# Patient Record
Sex: Male | Born: 1990 | Race: White | Hispanic: No | Marital: Single | Smoking: Former smoker
Health system: Southern US, Community
[De-identification: ages and names within clinical notes are randomized; demographics above are authoritative.]

---

## 2017-07-09 ENCOUNTER — Other Ambulatory Visit: Payer: Self-pay

## 2017-07-09 ENCOUNTER — Emergency Department: Payer: BLUE CROSS/BLUE SHIELD

## 2017-07-09 ENCOUNTER — Emergency Department
Admission: EM | Admit: 2017-07-09 | Discharge: 2017-07-09 | Disposition: A | Discharge status: Home / Self Care | Payer: BLUE CROSS/BLUE SHIELD | Attending: Emergency Medicine | Admitting: Emergency Medicine

## 2017-07-09 DIAGNOSIS — F17200 Nicotine dependence, unspecified, uncomplicated: Secondary | ICD-10-CM | POA: Insufficient documentation

## 2017-07-09 DIAGNOSIS — R319 Hematuria, unspecified: Secondary | ICD-10-CM | POA: Insufficient documentation

## 2017-07-09 DIAGNOSIS — R109 Unspecified abdominal pain: Secondary | ICD-10-CM | POA: Diagnosis present

## 2017-07-09 LAB — URINALYSIS, COMPLETE (UACMP) WITH MICROSCOPIC
BACTERIA UA: NONE SEEN
BILIRUBIN URINE: NEGATIVE
Glucose, UA: NEGATIVE mg/dL
Ketones, ur: NEGATIVE mg/dL
LEUKOCYTES UA: NEGATIVE
Nitrite: NEGATIVE
PH: 6 (ref 5.0–8.0)
Protein, ur: NEGATIVE mg/dL
SPECIFIC GRAVITY, URINE: 1.017 (ref 1.005–1.030)

## 2017-07-09 LAB — CBC
HCT: 42.9 % (ref 40.0–52.0)
HEMOGLOBIN: 14.9 g/dL (ref 13.0–18.0)
MCH: 30.3 pg (ref 26.0–34.0)
MCHC: 34.8 g/dL (ref 32.0–36.0)
MCV: 87 fL (ref 80.0–100.0)
PLATELETS: 246 10*3/uL (ref 150–440)
RBC: 4.93 MIL/uL (ref 4.40–5.90)
RDW: 12.5 % (ref 11.5–14.5)
WBC: 9.5 10*3/uL (ref 3.8–10.6)

## 2017-07-09 LAB — BASIC METABOLIC PANEL
ANION GAP: 8 (ref 5–15)
BUN: 17 mg/dL (ref 6–20)
CHLORIDE: 105 mmol/L (ref 101–111)
CO2: 26 mmol/L (ref 22–32)
CREATININE: 1.08 mg/dL (ref 0.61–1.24)
Calcium: 8.9 mg/dL (ref 8.9–10.3)
GFR calc non Af Amer: >60 mL/min (ref >60)
Glucose, Bld: 110 mg/dL — ABNORMAL HIGH (ref 65–99)
POTASSIUM: 3.5 mmol/L (ref 3.5–5.1)
SODIUM: 139 mmol/L (ref 135–145)

## 2017-07-09 NOTE — ED Provider Notes (Signed)
[facility] ([facility]) Emergency Department Provider Note  Time seen: 8:05 PM  I have reviewed the triage vital signs and the nursing notes.   HISTORY  Chief Complaint Flank Pain    HPI Kent Thompson is a 27 y.o. male with no significant past medical history presents to the emergency department for right flank pain.  According to the patient for the past 5 days he has been expensing intermittent right flank pain.  Describes as a dull numbing type pain in his right flank states it will move sometimes in the front of his abdomen sometimes in his back.  States he has noticed some mild urinary frequency but denies any dysuria or noted hematuria.  States his mom is a history of kidney stones but he is never personally had one.  Denies any fever, nausea, vomiting, diarrhea.  Largely negative review of systems.  Describes pain currently as mild 3/10 in severity.   History reviewed. No pertinent past medical history.  There are no active problems to display for this patient.   History reviewed. No pertinent surgical history.  Prior to Admission medications   Not on File    Not on File  History reviewed. No pertinent family history.  Social History Social History   Tobacco Use  . Smoking status: Current Some Day Smoker  Substance Use Topics  . Alcohol use: Yes  . Drug use: Not on file    Review of Systems Constitutional: Negative for fever. Eyes: Negative for visual complaints ENT: Negative for recent illness/congestion Cardiovascular: Negative for chest pain. Respiratory: Negative for shortness of breath. Gastrointestinal: Mild right flank pain.  Negative nausea vomiting diarrhea Genitourinary: Positive for urinary frequency.  Negative for dysuria or hematuria. Musculoskeletal: Negative for musculoskeletal complaints Skin: Negative for skin complaints  Neurological: Negative for headache All other ROS  negative  ____________________________________________   PHYSICAL EXAM:  VITAL SIGNS: ED Triage Vitals  Enc Vitals Group     BP 07/09/17 1817 (!) 155/81     Pulse Rate 07/09/17 1817 98     Resp 07/09/17 1817 16     Temp 07/09/17 1817 98.1 F (36.7 C)     Temp Source 07/09/17 1817 Oral     SpO2 07/09/17 1817 100 %     Weight 07/09/17 1819 160 lb (72.6 kg)     Height 07/09/17 1819  (1.803 m)     Head Circumference --      Peak Flow --      Pain Score 07/09/17 1818 3     Pain Loc --      Pain Edu? --      Excl. in GC? --    Constitutional: Alert and oriented. Well appearing and in no distress. Eyes: Normal exam ENT   Head: Normocephalic and atraumatic   Mouth/Throat: Mucous membranes are moist. Cardiovascular: Normal rate, regular rhythm. No murmur Respiratory: Normal respiratory effort without tachypnea nor retractions. Breath sounds are clear  Gastrointestinal: Soft and nontender. No distention.  Mild right CVA tenderness. Musculoskeletal: Nontender with normal range of motion in all extremities.  Neurologic:  Normal speech and language. No gross focal neurologic deficits Skin:  Skin is warm, dry and intact.  Psychiatric: Mood and affect are normal.  ____________________________________________    RADIOLOGY  CT scan negative for acute process.  ____________________________________________   INITIAL IMPRESSION / ASSESSMENT AND PLAN / ED COURSE  Pertinent labs & imaging results that were available during my care of the patient were reviewed by me  and considered in my medical decision making (see chart for details).  Patient presents to the emergency department for right flank pain intermittent over the past 5 days currently mild 3/10.  Differential would include ureterolithiasis, pyelonephritis, urinary tract infection, colitis, diverticulitis, gallbladder disease or appendicitis.  Overall the patient appears very well, no anterior abdominal tenderness  on exam but the patient does have mild right CVA tenderness.  Patient's labs show normal white blood cell count, normal kidney function.  Patient's urinalysis shows 6-10 red blood cells, given his history consistent with ureterolithiasis with urinalysis findings consistent with ureterolithiasis will obtain a CT scan of the patient's abdomen/pelvis to further evaluate as the patient has no prior history of kidney stones.  CT scan is negative.  Patient appears very well.  Given a [place] amount of blood and urine I have sent a urine culture.  Suspect recently passed stone.  We will discharge with supportive care at home Tylenol/ibuprofen as well as fluids and PCP follow-up if needed.  ____________________________________________   FINAL CLINICAL IMPRESSION(S) / ED DIAGNOSES  Right flank pain    Minna Antis, MD 07/09/17 2148

## 2017-07-09 NOTE — ED Triage Notes (Signed)
Pt arrives to ED with c/o of R sided back/flank pain. States it started near ribs and is now low back. States mother has kidney stones. No personal hx of kidney stones. States pain has come and gone over past week. States urinary frequency. Denies hematuria. Denies N&V&D& fever.   Alert, oriented, ambulatory.

## 2017-07-11 ENCOUNTER — Emergency Department: Payer: BLUE CROSS/BLUE SHIELD

## 2017-07-11 ENCOUNTER — Encounter: Payer: Self-pay | Admitting: Emergency Medicine

## 2017-07-11 ENCOUNTER — Emergency Department
Admission: EM | Admit: 2017-07-11 | Discharge: 2017-07-11 | Disposition: A | Discharge status: Home / Self Care | Payer: BLUE CROSS/BLUE SHIELD | Attending: Emergency Medicine | Admitting: Emergency Medicine

## 2017-07-11 ENCOUNTER — Other Ambulatory Visit: Payer: Self-pay

## 2017-07-11 DIAGNOSIS — R079 Chest pain, unspecified: Secondary | ICD-10-CM | POA: Diagnosis not present

## 2017-07-11 DIAGNOSIS — R1011 Right upper quadrant pain: Secondary | ICD-10-CM | POA: Insufficient documentation

## 2017-07-11 DIAGNOSIS — F17200 Nicotine dependence, unspecified, uncomplicated: Secondary | ICD-10-CM | POA: Diagnosis not present

## 2017-07-11 LAB — URINALYSIS, COMPLETE (UACMP) WITH MICROSCOPIC
BACTERIA UA: NONE SEEN
BILIRUBIN URINE: NEGATIVE
Glucose, UA: NEGATIVE mg/dL
Ketones, ur: 80 mg/dL — AB
Leukocytes, UA: NEGATIVE
Nitrite: NEGATIVE
PROTEIN: NEGATIVE mg/dL
SPECIFIC GRAVITY, URINE: 1.024 (ref 1.005–1.030)
SQUAMOUS EPITHELIAL / LPF: NONE SEEN (ref 0–5)
pH: 5 (ref 5.0–8.0)

## 2017-07-11 LAB — COMPREHENSIVE METABOLIC PANEL
ALBUMIN: 4.7 g/dL (ref 3.5–5.0)
ALT: 22 U/L (ref 17–63)
ANION GAP: 9 (ref 5–15)
AST: 25 U/L (ref 15–41)
Alkaline Phosphatase: 52 U/L (ref 38–126)
BUN: 22 mg/dL — AB (ref 6–20)
CHLORIDE: 105 mmol/L (ref 101–111)
CO2: 25 mmol/L (ref 22–32)
Calcium: 9.6 mg/dL (ref 8.9–10.3)
Creatinine, Ser: 1.02 mg/dL (ref 0.61–1.24)
GFR calc Af Amer: >60 mL/min (ref >60)
GFR calc non Af Amer: >60 mL/min (ref >60)
GLUCOSE: 105 mg/dL — AB (ref 65–99)
POTASSIUM: 3.5 mmol/L (ref 3.5–5.1)
SODIUM: 139 mmol/L (ref 135–145)
Total Bilirubin: 0.6 mg/dL (ref 0.3–1.2)
Total Protein: 7.4 g/dL (ref 6.5–8.1)

## 2017-07-11 LAB — CBC
HEMATOCRIT: 43.3 % (ref 40.0–52.0)
HEMOGLOBIN: 15.1 g/dL (ref 13.0–18.0)
MCH: 30.1 pg (ref 26.0–34.0)
MCHC: 34.8 g/dL (ref 32.0–36.0)
MCV: 86.5 fL (ref 80.0–100.0)
Platelets: 250 10*3/uL (ref 150–440)
RBC: 5.01 MIL/uL (ref 4.40–5.90)
RDW: 12.6 % (ref 11.5–14.5)
WBC: 11.4 10*3/uL — ABNORMAL HIGH (ref 3.8–10.6)

## 2017-07-11 LAB — URINE CULTURE: CULTURE: NO GROWTH

## 2017-07-11 LAB — LIPASE, BLOOD: LIPASE: 24 U/L (ref 11–51)

## 2017-07-11 LAB — TROPONIN I

## 2017-07-11 NOTE — ED Triage Notes (Signed)
Pt to ED c/o RUQ pain intermittently radiating to epigastric area started this past Monday, noticed some blood in urine and frequency, pain worse with movement to abd, some diarrhea but no n/v.  Last took 2 aleve 1530 today.

## 2017-07-11 NOTE — ED Provider Notes (Signed)
[facility] Emergency Department Provider Note  ___________________________________________   First MD Initiated Contact with Patient 07/11/17 1807     (approximate)  I have reviewed the triage vital signs and the nursing notes.   HISTORY  Chief Complaint Abdominal Pain   HPI Kent Thompson is a 27 y.o. male any chronic medical conditions was presenting with right upper quadrant abdominal pain as well as right flank pain since this past Sunday.  He says the pain is intermittent and worse with movement.  Denies any nausea vomiting or diarrhea.  States that he was here in the emergency department this past Sunday and had a CAT scan which did not show any acute abnormality.  No history of kidney stones.  Returning today because he said that when he was doing work earlier today the pain returned.  He says that the pain is now moved into his lower, central chest and describes the pain as a pressure.  Does not report any shortness of breath.  Patient has history of heartburn and says he takes a daily over-the-counter medication and this does not feel like his heartburn.   History reviewed. No pertinent past medical history.  There are no active problems to display for this patient.   History reviewed. No pertinent surgical history.  Prior to Admission medications   Not on File    Allergies Patient has no known allergies.  History reviewed. No pertinent family history.  Social History Social History   Tobacco Use  . Smoking status: Current Some Day Smoker  . Smokeless tobacco: Former Engineer, water Use Topics  . Alcohol use: Yes    Comment: couple drinks a week  . Drug use: Never    Review of Systems  Constitutional: No fever/chills Eyes: No visual changes. ENT: No sore throat. Cardiovascular: As above Respiratory: Denies shortness of breath. Gastrointestinal:  No nausea, no vomiting.  No diarrhea.  No constipation. Genitourinary: Negative for  dysuria. Musculoskeletal: Negative for back pain. Skin: Negative for rash. Neurological: Negative for headaches, focal weakness or numbness.   ____________________________________________   PHYSICAL EXAM:  VITAL SIGNS: ED Triage Vitals  Enc Vitals Group     BP 07/11/17 1736 (!) 147/88     Pulse Rate 07/11/17 1736 90     Resp 07/11/17 1736 16     Temp 07/11/17 1736 98.2 F (36.8 C)     Temp Source 07/11/17 1736 Oral     SpO2 07/11/17 1736 100 %     Weight 07/11/17 1742 160 lb (72.6 kg)     Height 07/11/17 1742  (1.803 m)     Head Circumference --      Peak Flow --      Pain Score 07/11/17 1741 2     Pain Loc --      Pain Edu? --      Excl. in GC? --     Constitutional: Alert and oriented. Well appearing and in no acute distress. Eyes: Conjunctivae are normal.  Head: Atraumatic. Nose: No congestion/rhinnorhea. Mouth/Throat: Mucous membranes are moist.  Neck: No stridor.   Cardiovascular: Normal rate, regular rhythm. Grossly normal heart sounds.  Respiratory: Normal respiratory effort.  No retractions. Lungs CTAB. Gastrointestinal: Soft and nontender. No distention. No CVA tenderness.  Negative Murphy sign Musculoskeletal: No lower extremity tenderness nor edema.  No joint effusions. Neurologic:  Normal speech and language. No gross focal neurologic deficits are appreciated. Skin:  Skin is warm, dry and intact. No rash noted.  Psychiatric: Mood and affect are normal. Speech and behavior are normal.  ____________________________________________   LABS (all labs ordered are listed, but only abnormal results are displayed)  Labs Reviewed  COMPREHENSIVE METABOLIC PANEL - Abnormal; Notable for the following components:      Result Value   Glucose, Bld 105 (*)    BUN 22 (*)    All other components within normal limits  CBC - Abnormal; Notable for the following components:   WBC 11.4 (*)    All other components within normal limits  URINALYSIS, COMPLETE (UACMP)  WITH MICROSCOPIC - Abnormal; Notable for the following components:   Color, Urine YELLOW (*)    APPearance CLEAR (*)    Hgb urine dipstick MODERATE (*)    Ketones, ur 80 (*)    All other components within normal limits  LIPASE, BLOOD  TROPONIN I   ____________________________________________  EKG  ED ECG REPORT I, Arelia Longest, the attending physician, personally viewed and interpreted this ECG.   Date: 07/11/2017  EKG Time: 1839  Rate: 95  Rhythm: normal sinus rhythm  Axis: Normal  Intervals:none  ST&T Change: No ST segment elevation or depression.  No abnormal T wave inversion.  ____________________________________________  RADIOLOGY No acute finding on the ultrasound of the right upper quadrant with a chest x-ray.   ____________________________________________   PROCEDURES  Procedure(s) performed:   Procedures  Critical Care performed:   ____________________________________________   INITIAL IMPRESSION / ASSESSMENT AND PLAN / ED COURSE  Pertinent labs & imaging results that were available during my care of the patient were reviewed by me and considered in my medical decision making (see chart for details).  Differential diagnosis includes, but is not limited to, biliary disease (biliary colic, acute cholecystitis, cholangitis, choledocholithiasis, etc), intrathoracic causes for epigastric abdominal pain including ACS, gastritis, duodenitis, pancreatitis, [place] bowel or large bowel obstruction, abdominal aortic aneurysm, hernia, and gastritis. As part of my medical decision making, I reviewed the following data within the electronic MEDICAL RECORD NUMBER Notes from prior ED visits  ----------------------------------------- 7:30 PM on 07/11/2017 -----------------------------------------  Patient at this time resting without any distress.  Very reassuring work-up.  Patient now saying that initially he felt a cramp to his right upper quadrant and then has had  intermittent pain ever since.  I feel that the pain could be musculoskeletal.  However, I will give him GI follow-up for further consultation with a gastroenterologist for possible HIDA scan.  Patient says that his pain was relieved with a heating pad and will continue to use this at home.  We will also continue Aleve and topical salve such as Aspercreme or icy hot as needed.  ____________________________________________   FINAL CLINICAL IMPRESSION(S) / ED DIAGNOSES  Right upper quadrant pain.  Chest pain.  NEW MEDICATIONS STARTED DURING THIS VISIT:  New Prescriptions   No medications on file     Note:  This document was prepared using Dragon voice recognition software and may include unintentional dictation errors.     Myrna Blazer, MD 07/11/17 [phone]

## 2017-07-19 ENCOUNTER — Ambulatory Visit: Payer: BLUE CROSS/BLUE SHIELD | Admitting: Gastroenterology

## 2017-08-23 ENCOUNTER — Emergency Department
Admission: EM | Admit: 2017-08-23 | Discharge: 2017-08-23 | Disposition: A | Discharge status: Home / Self Care | Payer: BLUE CROSS/BLUE SHIELD | Attending: Emergency Medicine | Admitting: Emergency Medicine

## 2017-08-23 ENCOUNTER — Other Ambulatory Visit: Payer: Self-pay

## 2017-08-23 DIAGNOSIS — F17200 Nicotine dependence, unspecified, uncomplicated: Secondary | ICD-10-CM | POA: Insufficient documentation

## 2017-08-23 DIAGNOSIS — R197 Diarrhea, unspecified: Secondary | ICD-10-CM | POA: Diagnosis not present

## 2017-08-23 DIAGNOSIS — R55 Syncope and collapse: Secondary | ICD-10-CM | POA: Insufficient documentation

## 2017-08-23 DIAGNOSIS — R109 Unspecified abdominal pain: Secondary | ICD-10-CM | POA: Diagnosis not present

## 2017-08-23 DIAGNOSIS — R42 Dizziness and giddiness: Secondary | ICD-10-CM | POA: Insufficient documentation

## 2017-08-23 LAB — URINALYSIS, COMPLETE (UACMP) WITH MICROSCOPIC
BACTERIA UA: NONE SEEN
BILIRUBIN URINE: NEGATIVE
Glucose, UA: NEGATIVE mg/dL
Ketones, ur: NEGATIVE mg/dL
Leukocytes, UA: NEGATIVE
NITRITE: NEGATIVE
PROTEIN: NEGATIVE mg/dL
Specific Gravity, Urine: 1.002 — ABNORMAL LOW (ref 1.005–1.030)
Squamous Epithelial / LPF: NONE SEEN (ref 0–5)
pH: 7 (ref 5.0–8.0)

## 2017-08-23 LAB — COMPREHENSIVE METABOLIC PANEL
ALBUMIN: 4.9 g/dL (ref 3.5–5.0)
ALK PHOS: 48 U/L (ref 38–126)
ALT: 17 U/L (ref 17–63)
ANION GAP: 12 (ref 5–15)
AST: 24 U/L (ref 15–41)
BUN: 15 mg/dL (ref 6–20)
CHLORIDE: 102 mmol/L (ref 101–111)
CO2: 23 mmol/L (ref 22–32)
Calcium: 9.5 mg/dL (ref 8.9–10.3)
Creatinine, Ser: 1.05 mg/dL (ref 0.61–1.24)
GFR calc Af Amer: >60 mL/min (ref >60)
GFR calc non Af Amer: >60 mL/min (ref >60)
GLUCOSE: 130 mg/dL — AB (ref 65–99)
POTASSIUM: 3.2 mmol/L — AB (ref 3.5–5.1)
SODIUM: 137 mmol/L (ref 135–145)
Total Bilirubin: 0.5 mg/dL (ref 0.3–1.2)
Total Protein: 7.7 g/dL (ref 6.5–8.1)

## 2017-08-23 LAB — CBC
HEMATOCRIT: 44.3 % (ref 40.0–52.0)
HEMOGLOBIN: 15.6 g/dL (ref 13.0–18.0)
MCH: 30.4 pg (ref 26.0–34.0)
MCHC: 35.2 g/dL (ref 32.0–36.0)
MCV: 86.4 fL (ref 80.0–100.0)
Platelets: 265 10*3/uL (ref 150–440)
RBC: 5.13 MIL/uL (ref 4.40–5.90)
RDW: 12.7 % (ref 11.5–14.5)
WBC: 10 10*3/uL (ref 3.8–10.6)

## 2017-08-23 LAB — GLUCOSE, CAPILLARY: Glucose-Capillary: 115 mg/dL — ABNORMAL HIGH (ref 65–99)

## 2017-08-23 MED ORDER — SUCRALFATE 1 G PO TABS: 1.0000 g | ORAL_TABLET | Freq: Four times a day (QID) | ORAL | 0 refills | Status: DC

## 2017-08-23 NOTE — ED Provider Notes (Signed)
[facility] Emergency Department Provider Note  ___________________________________________   First MD Initiated Contact with Patient 08/23/17 1931     (approximate)  I have reviewed the triage vital signs and the nursing notes.   HISTORY  Chief Complaint Near Syncope   HPI Kent Thompson is a 27 y.o. male with a several month history of abdominal cramping as well as loose stools was presented to the emergency department for abdominal cramping earlier today and a feeling of near syncope while waiting over the [facility].  Patient says that he became lightheaded while waiting in the clinic waiting room.  Also associated abdominal cramping today to the upper abdomen.  No nausea or vomiting.  Patient says that he takes Prevacid as needed as well as an antianxiety medication daily at night.  He is also been seen by GI on June 6 and opted not to go forward with further testing including a HIDA scan and endoscopy and colonoscopy because of the patient being asymptomatic at that time.  However, the patient became cinematic today and reported to the critical clinic and was thus dispositioned over to the emergency department for further work-up when they found out that he was near syncopal.  Father is accompanying the patient and says that he has similar symptoms that are controlled with Prevacid daily instead of just as needed.  History reviewed. No pertinent past medical history.  There are no active problems to display for this patient.   History reviewed. No pertinent surgical history.  Prior to Admission medications   Not on File    Allergies Patient has no known allergies.  No family history on file.  Social History Social History   Tobacco Use  . Smoking status: Current Some Day Smoker  . Smokeless tobacco: Former Engineer, water Use Topics  . Alcohol use: Yes    Comment: couple drinks a week  . Drug use: Never    Review of  Systems  Constitutional: No fever/chills Eyes: No visual changes. ENT: No sore throat. Cardiovascular: Denies chest pain. Respiratory: Denies shortness of breath. Gastrointestinal:  No nausea, no vomiting.  No constipation. Genitourinary: Negative for dysuria. Musculoskeletal: Negative for back pain. Skin: Negative for rash. Neurological: Negative for headaches, focal weakness or numbness.   ____________________________________________   PHYSICAL EXAM:  VITAL SIGNS: ED Triage Vitals [08/23/17 1840]  Enc Vitals Group     BP 128/74     Pulse Rate (!) 105     Resp 16     Temp 98.4 F (36.9 C)     Temp src      SpO2 100 %     Weight 150 lb (68 kg)     Height 5\' 10"  (1.778 m)     Head Circumference      Peak Flow      Pain Score 1     Pain Loc      Pain Edu?      Excl. in GC?     Constitutional: Alert and oriented. Well appearing and in no acute distress. Eyes: Conjunctivae are normal.  Head: Atraumatic. Nose: No congestion/rhinnorhea. Mouth/Throat: Mucous membranes are moist.  Neck: No stridor.   Cardiovascular: Normal rate, regular rhythm. Grossly normal heart sounds.  Respiratory: Normal respiratory effort.  No retractions. Lungs CTAB. Gastrointestinal: Soft and nontender. No distention. No CVA tenderness. Musculoskeletal: No lower extremity tenderness nor edema.  No joint effusions. Neurologic:  Normal speech and language. No gross focal neurologic deficits are appreciated. Skin:  Skin is warm, dry and intact. No rash noted. Psychiatric: Mood and affect are normal. Speech and behavior are normal.  ____________________________________________   LABS (all labs ordered are listed, but only abnormal results are displayed)  Labs Reviewed  URINALYSIS, COMPLETE (UACMP) WITH MICROSCOPIC - Abnormal; Notable for the following components:      Result Value   Color, Urine COLORLESS (*)    APPearance CLEAR (*)    Specific Gravity, Urine 1.002 (*)    Hgb urine  dipstick [place] (*)    All other components within normal limits  COMPREHENSIVE METABOLIC PANEL - Abnormal; Notable for the following components:   Potassium 3.2 (*)    Glucose, Bld 130 (*)    All other components within normal limits  GLUCOSE, CAPILLARY - Abnormal; Notable for the following components:   Glucose-Capillary 115 (*)    All other components within normal limits  CBC  CBG MONITORING, ED   ____________________________________________  EKG  ED ECG REPORT I, Arelia Longestavid M Schaevitz, the attending physician, personally viewed and interpreted this ECG.   Date: 08/23/2017  EKG Time: 1841  Rate: 96  Rhythm: normal sinus rhythm.  Appears that the patient may have moved and upset lead V1.  This appears to be artifact.  Axis: Normal  Intervals:none  ST&T Change: No ST segment elevation or depression.  No abnormal T wave inversion.  ____________________________________________  RADIOLOGY   ____________________________________________   PROCEDURES  Procedure(s) performed:   Procedures  Critical Care performed:   ____________________________________________   INITIAL IMPRESSION / ASSESSMENT AND PLAN / ED COURSE  Pertinent labs & imaging results that were available during my care of the patient were reviewed by me and considered in my medical decision making (see chart for details).  Differential diagnosis includes, but is not limited to, acute appendicitis, renal colic, testicular torsion, urinary tract infection/pyelonephritis, prostatitis,  epididymitis, diverticulitis, [place] bowel obstruction or ileus, colitis, abdominal aortic aneurysm, gastroenteritis, hernia, etc. As part of my medical decision making, I reviewed the following data within the electronic MEDICAL RECORD NUMBER Old chart reviewed and Notes from prior ED visits Near syncope, arrhythmia  Patient asymptomatic at this time.  Recommended that he use Prevacid daily and will add sucralfate.  I also recommend  that he follow-up with Dr. Norma Fredricksonoledo and pursue further testing including the HIDA, colonoscopy and endoscopy since he has now been again symptomatic.  Likely vasovagal episode related to abdominal pain.  Patient understands the diagnosis as well as treatment and willing to comply.  Will be discharged home.  ____________________________________________   FINAL CLINICAL IMPRESSION(S) / ED DIAGNOSES  Abdominal pain.  Near syncope.    NEW MEDICATIONS STARTED DURING THIS VISIT:  New Prescriptions   No medications on file     Note:  This document was prepared using Dragon voice recognition software and may include unintentional dictation errors.     Myrna BlazerSchaevitz, David Matthew, MD 08/23/17 2034

## 2017-08-23 NOTE — ED Triage Notes (Signed)
To ER via POV c/o near syncope that happened PTA. [facility] be [facility] for fatigue, mild abdominal pain. Pt denies syncope. Pt alert and oriented X4, active, cooperative, pt in NAD. RR even and unlabored, color WNL.  Lower abdominal pain continues.

## 2017-08-23 NOTE — ED Notes (Signed)
Patient [facility] and they sent him over here. States he has been feeling weak and just "not feeling well" patient states he has been to multiple doctors in the last month to find out what was going on.

## 2017-08-29 ENCOUNTER — Other Ambulatory Visit
Admission: RE | Admit: 2017-08-29 | Discharge: 2017-08-29 | Disposition: A | Discharge status: Home / Self Care | Payer: BLUE CROSS/BLUE SHIELD | Source: Ambulatory Visit | Attending: Gastroenterology | Admitting: Gastroenterology

## 2017-08-29 DIAGNOSIS — R194 Change in bowel habit: Secondary | ICD-10-CM | POA: Diagnosis present

## 2017-08-29 DIAGNOSIS — K219 Gastro-esophageal reflux disease without esophagitis: Secondary | ICD-10-CM | POA: Diagnosis present

## 2017-08-29 LAB — GASTROINTESTINAL PANEL BY PCR, STOOL (REPLACES STOOL CULTURE)

## 2017-08-31 LAB — H. PYLORI ANTIGEN, STOOL: H. Pylori Stool Ag, Eia: NEGATIVE

## 2017-09-03 ENCOUNTER — Emergency Department
Admission: EM | Admit: 2017-09-03 | Discharge: 2017-09-03 | Disposition: A | Discharge status: Home / Self Care | Payer: BLUE CROSS/BLUE SHIELD | Attending: Emergency Medicine | Admitting: Emergency Medicine

## 2017-09-03 ENCOUNTER — Other Ambulatory Visit: Payer: Self-pay

## 2017-09-03 DIAGNOSIS — F419 Anxiety disorder, unspecified: Secondary | ICD-10-CM

## 2017-09-03 DIAGNOSIS — F17200 Nicotine dependence, unspecified, uncomplicated: Secondary | ICD-10-CM | POA: Insufficient documentation

## 2017-09-03 LAB — COMPREHENSIVE METABOLIC PANEL
ALBUMIN: 4.9 g/dL (ref 3.5–5.0)
ALT: 20 U/L (ref 17–63)
AST: 27 U/L (ref 15–41)
Alkaline Phosphatase: 135 U/L — ABNORMAL HIGH (ref 38–126)
Anion gap: 10 (ref 5–15)
BUN: 17 mg/dL (ref 6–20)
CHLORIDE: 101 mmol/L (ref 101–111)
CO2: 26 mmol/L (ref 22–32)
CREATININE: 0.87 mg/dL (ref 0.61–1.24)
Calcium: 9.5 mg/dL (ref 8.9–10.3)
GFR calc Af Amer: >60 mL/min (ref >60)
GLUCOSE: 144 mg/dL — AB (ref 65–99)
POTASSIUM: 3.1 mmol/L — AB (ref 3.5–5.1)
Sodium: 137 mmol/L (ref 135–145)
Total Bilirubin: 0.4 mg/dL (ref 0.3–1.2)
Total Protein: 7.8 g/dL (ref 6.5–8.1)

## 2017-09-03 LAB — CBC
HCT: 45.4 % (ref 40.0–52.0)
Hemoglobin: 16.4 g/dL (ref 13.0–18.0)
MCH: 30.6 pg (ref 26.0–34.0)
MCHC: 36.2 g/dL — ABNORMAL HIGH (ref 32.0–36.0)
MCV: 84.6 fL (ref 80.0–100.0)
PLATELETS: 254 10*3/uL (ref 150–440)
RBC: 5.36 MIL/uL (ref 4.40–5.90)
RDW: 12.7 % (ref 11.5–14.5)
WBC: 9.3 10*3/uL (ref 3.8–10.6)

## 2017-09-03 MED ORDER — LORAZEPAM 0.5 MG PO TABS
0.5000 mg | ORAL_TABLET | Freq: Once | ORAL | Status: AC
  Administered 2017-09-03: 0.5 mg via ORAL
  Filled 2017-09-03: qty 1

## 2017-09-03 MED ORDER — LORAZEPAM 0.5 MG PO TABS: 0.5000 mg | ORAL_TABLET | Freq: Every day | ORAL | 0 refills | Status: AC | PRN

## 2017-09-03 NOTE — ED Notes (Signed)
Pt discharged to home.  Family member driving.  Discharge instructions reviewed.  Verbalized understanding.  No questions or concerns at this time.  Teach back verified.  Pt in NAD.  No items left in ED.   

## 2017-09-03 NOTE — ED Provider Notes (Signed)
[facility] Emergency Department Provider Note  Time seen: 9:15 PM  I have reviewed the triage vital signs and the nursing notes.   HISTORY  Chief Complaint Weakness (Not feeling well) and Dizziness (Not feeling well.)    HPI Cheryln ManlyRyan Cribb is a 27 y.o. male with no past medical history who presents to the emergency department for not feeling well.  According to the patient he was at dinner when he all of a sudden began feeling short of breath with some chest tightness states he felt very weird like something was not right in his body.  Began to be very shaky so he came to the emergency department for evaluation.  Patient states this is his fourth emergency department since April.  2 of which have been for similar events the other 2 have been for abdominal pain, he is currently seeing GI for this and has a follow-up appointment tomorrow.  Patient denies any issues with the abdominal pain today but states he was not sure what was going on and was worried that something could be going on in his body.  States his symptoms have largely resolved at this time.  Patient states similar events have happened previously where he gets shaky and feels like something is wrong followed by feeling very tired and wiped out.   History reviewed. No pertinent past medical history.  There are no active problems to display for this patient.   History reviewed. No pertinent surgical history.  Prior to Admission medications   Medication Sig Start Date End Date Taking? Authorizing Provider  sucralfate (CARAFATE) 1 g tablet Take 1 tablet (1 g total) by mouth 4 (four) times daily. 08/23/17 08/23/18  Schaevitz, Myra Rudeavid Matthew, MD    No Known Allergies  No family history on file.  Social History Social History   Tobacco Use  . Smoking status: Current Some Day Smoker  . Smokeless tobacco: Former Engineer, waterUser  Substance Use Topics  . Alcohol use: Yes    Comment: couple drinks a week  . Drug use:  Never    Review of Systems Constitutional: Negative for fever. Cardiovascular: Negative for chest pain. Respiratory: Negative for shortness of breath. Gastrointestinal: Intermittent right upper quadrant pain currently being seen by GI medicine for the same. Neurological: Negative for headache All other ROS negative  ____________________________________________   PHYSICAL EXAM:  VITAL SIGNS: ED Triage Vitals  Enc Vitals Group     BP 09/03/17 1951 133/83     Pulse Rate 09/03/17 1951 (!) 106     Resp 09/03/17 1951 17     Temp 09/03/17 1951 98.4 F (36.9 C)     Temp Source 09/03/17 1951 Oral     SpO2 09/03/17 1951 100 %     Weight 09/03/17 1955 150 lb (68 kg)     Height 09/03/17 1955 5\' 10"  (1.778 m)     Head Circumference --      Peak Flow --      Pain Score 09/03/17 1955 0     Pain Loc --      Pain Edu? --      Excl. in GC? --    Constitutional: Alert and oriented. Well appearing and in no distress.  Moderately anxious in appearance. Eyes: Normal exam ENT   Head: Normocephalic and atraumatic.   Mouth/Throat: Mucous membranes are moist. Cardiovascular: Normal rate, regular rhythm. No murmur Respiratory: Normal respiratory effort without tachypnea nor retractions. Breath sounds are clear Gastrointestinal: Soft and nontender. No distention.  Musculoskeletal: Nontender with normal range of motion in all extremities.  Neurologic:  Normal speech and language. No gross focal neurologic deficits Skin:  Skin is warm, dry and intact.  Psychiatric: Appears anxious at times, speaks quickly, often interrupting.  ____________________________________________    EKG  EKG reviewed and interpreted by myself shows sinus tachycardia at 121 bpm with a narrow QRS, normal axis, normal intervals, nonspecific ST changes.  ____________________________________________   INITIAL IMPRESSION / ASSESSMENT AND PLAN / ED COURSE  Pertinent labs & imaging results that were available  during my care of the patient were reviewed by me and considered in my medical decision making (see chart for details).  Patient presents to the emergency department for feeling like something could be going wrong with his body.  States he became short of breath with some chest tightness today became shaky followed by weakness.  Patient symptoms are very suggestive of anxiety.  Patient's lab work is largely at baseline.  He has had multiple work-ups recently with normal results.  Highly suspect anxiety.  I discussed with patient the possibility of discharging with a short course of Ativan and have the patient follow-up with the primary care doctor to discuss possible SSRI.  Patient agreeable to this plan of care.  ____________________________________________   FINAL CLINICAL IMPRESSION(S) / ED DIAGNOSES  Anxiety    Minna Antis, MD 09/03/17 2119

## 2017-09-03 NOTE — ED Notes (Signed)
Pt states that he feels similarly since last visits in ER.  Pt is A&Ox4, in NAD.  Pt states he has abdominal pain, feels weak all over and gets slightly dizzy.  EDP in room speaking with patient at this time.

## 2017-09-03 NOTE — ED Triage Notes (Signed)
Patient to ED with complaints of feeling weak and dizzy while waiting for his dinner. Ate dinner and then decided he needed to be checked as he was driving by the hospital. Cannot describe the feeling but states it comes over him and makes him feel very weird. Seen here a week ago for abdominal issues.

## 2017-09-11 ENCOUNTER — Encounter: Payer: Self-pay | Admitting: Internal Medicine

## 2017-09-11 ENCOUNTER — Encounter

## 2017-09-11 ENCOUNTER — Ambulatory Visit: Payer: BLUE CROSS/BLUE SHIELD | Admitting: Internal Medicine

## 2017-09-11 VITALS — BP 118/70 | HR 122 | Temp 98.5°F | Ht 69.75 in | Wt 146.0 lb

## 2017-09-11 DIAGNOSIS — Z23 Encounter for immunization: Secondary | ICD-10-CM

## 2017-09-11 DIAGNOSIS — F411 Generalized anxiety disorder: Secondary | ICD-10-CM | POA: Insufficient documentation

## 2017-09-11 DIAGNOSIS — R1011 Right upper quadrant pain: Secondary | ICD-10-CM | POA: Insufficient documentation

## 2017-09-11 DIAGNOSIS — F419 Anxiety disorder, unspecified: Secondary | ICD-10-CM | POA: Diagnosis not present

## 2017-09-11 MED ORDER — DULOXETINE HCL 30 MG PO CPEP: 30.0000 mg | ORAL_CAPSULE | Freq: Every day | ORAL | 3 refills | Status: DC

## 2017-09-11 NOTE — Assessment & Plan Note (Signed)
This seems better CT and ultrasound all normal Reassured about this

## 2017-09-11 NOTE — Addendum Note (Signed)
Addended by: Eual FinesBRIDGES, SHANNON P on: 09/11/2017 05:30 PM   Modules accepted: Orders

## 2017-09-11 NOTE — Assessment & Plan Note (Signed)
Currently worried that he has something wrong with his heart---reassured Has dizzy feelings and tachycardia 2 ER visits with probably anxiety symptoms Lorazepam helps but he is using it every day---often twice a day Discussed that this is not a reasonable alternative Will try low dose duloxetine Stay off the lorazepam
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
## 2017-10-09 ENCOUNTER — Ambulatory Visit: Payer: BLUE CROSS/BLUE SHIELD | Admitting: Internal Medicine

## 2017-10-09 DIAGNOSIS — Z0289 Encounter for other administrative examinations: Secondary | ICD-10-CM

## 2017-10-31 ENCOUNTER — Ambulatory Visit: Payer: BLUE CROSS/BLUE SHIELD | Admitting: Internal Medicine

## 2017-11-10 ENCOUNTER — Encounter: Payer: Self-pay | Admitting: Internal Medicine

## 2017-11-10 ENCOUNTER — Encounter

## 2017-11-10 ENCOUNTER — Ambulatory Visit: Payer: BLUE CROSS/BLUE SHIELD | Admitting: Internal Medicine

## 2017-11-10 VITALS — BP 110/76 | HR 71 | Temp 97.8°F | Ht 70.0 in | Wt 155.0 lb

## 2017-11-10 DIAGNOSIS — F411 Generalized anxiety disorder: Secondary | ICD-10-CM

## 2017-11-10 NOTE — Progress Notes (Signed)
Subjective:    Patient ID: Kent Thompson, male    DOB: 02/06/1991, 27 y.o.   MRN: [id number]  HPI Here for follow up of his anxiety  [place] [facility] [place] after starting the duloxetine Didn't have the ativan to try Went to ER--- palpitations, weakness, etc Work up benign--got ativan in ER  Since then, anxiety is controlled Feels his anxiety is actually better than when first starting  Did have evaluation at [facility]--had cardiac patch monitor (no arrhythmia noted) EEG and stress test also done---negative  Current Outpatient Medications on File Prior to Visit  Medication Sig Dispense Refill  . DULoxetine (CYMBALTA) 30 MG capsule Take 1 capsule (30 mg total) by mouth daily. 30 capsule 3  . LORazepam (ATIVAN) 0.5 MG tablet Take 1 tablet (0.5 mg total) by mouth daily as needed for anxiety. 20 tablet 0  . sucralfate (CARAFATE) 1 g tablet Take 1 tablet (1 g total) by mouth 4 (four) times daily. 60 tablet 0   No current facility-administered medications on file prior to visit.     No Known Allergies  History reviewed. No pertinent past medical history.  History reviewed. No pertinent surgical history.  Family History  Problem Relation Age of Onset  . Depression Mother   . Kidney Stones Mother   . Esophageal cancer Maternal Grandfather     Social History   Socioeconomic History  . Marital status: Single    Spouse name: Not on file  . Number of children: Not on file  . Years of education: Not on file  . Highest education level: Not on file  Occupational History  . Occupation: [employer]    Comment: self employed  Social Needs  . Financial resource strain: Not on file  . Food insecurity:    Worry: Not on file    Inability: Not on file  . Transportation needs:    Medical: Not on file    Non-medical: Not on file  Tobacco Use  . Smoking status: Former Games developermoker  . Smokeless tobacco: Former Engineer, waterUser  Substance and Sexual Activity  . Alcohol use: Yes    Comment: none since April  . Drug use: Never  . Sexual activity: Not on file  Lifestyle  . Physical activity:    Days per week: Not on file    Minutes per session: Not on file  . Stress: Not on file  Relationships  . Social connections:    Talks on phone: Not on file    Gets together: Not on file    Attends religious service: Not on file    Active member of club or organization: Not on file    Attends meetings of clubs or organizations: Not on file    Relationship status: Not on file  . Intimate partner violence:    Fear of current or ex partner: Not on file    Emotionally abused: Not on file    Physically abused: Not on file    Forced sexual activity: Not on file  Other Topics Concern  . Not on file  Social History Narrative  . Not on file   Review of Systems Sleeping well in general---though a few bad nights recently  Appetite is good Has regained some of the weight he had lost    Objective:   Physical Exam  Constitutional: He appears well-developed. No distress.  Psychiatric: He has a normal mood and affect. His behavior is normal.  Anxiety seems better More  typical speech and discussion (not pressured, etc)           Assessment & Plan:

## 2017-11-10 NOTE — Assessment & Plan Note (Signed)
Has done well with the low dose duloxetine Discussed possible need to increase med Has lorazepam for prn

## 2017-11-10 NOTE — Patient Instructions (Signed)
Please let me know if the spells or anxiety start coming on again--we will just need to increase the duloxetine.

## 2017-11-28 ENCOUNTER — Ambulatory Visit: Payer: BLUE CROSS/BLUE SHIELD | Admitting: Internal Medicine

## 2018-05-09 IMAGING — US US ABDOMEN LIMITED
1 series · 14 of 25 positions shown · non-contrast
Comparison: Abdomen and pelvis CT dated 07/09/2017.

CLINICAL DATA: Intermittent right upper quadrant abdominal pain
radiating to the epigastric region for the past week.

EXAM:
ULTRASOUND ABDOMEN LIMITED RIGHT UPPER QUADRANT

[Series 1: us abdomen limited · 0.17mm/px · 14 of 37 slices shown]
[im 1/37]
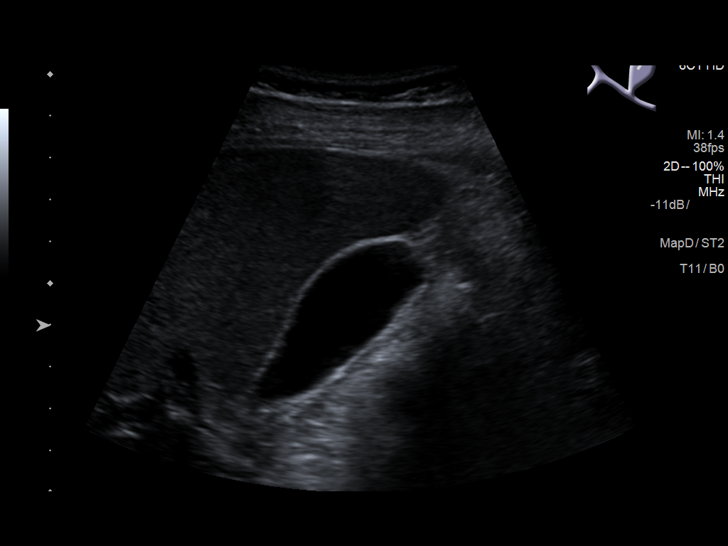
[im 4/37]
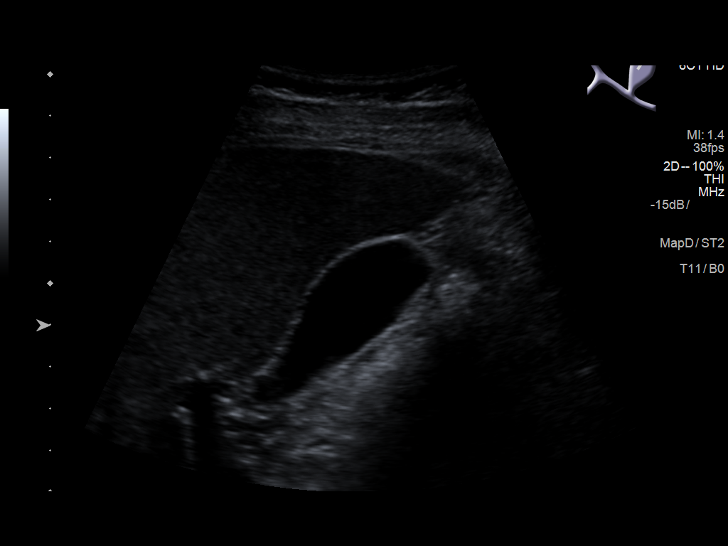
[im 7/37]
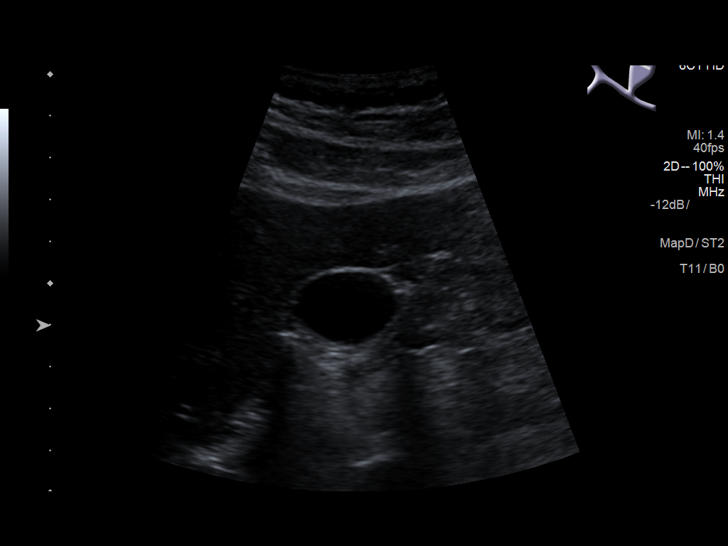
[im 10/37]
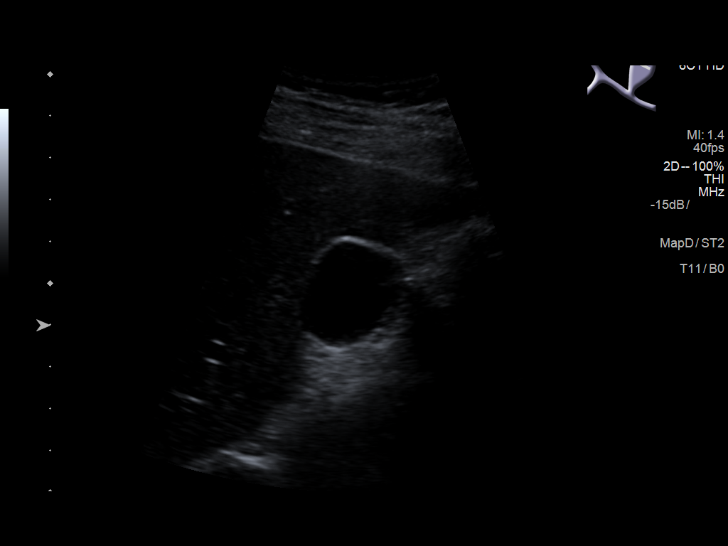
[im 13/37]
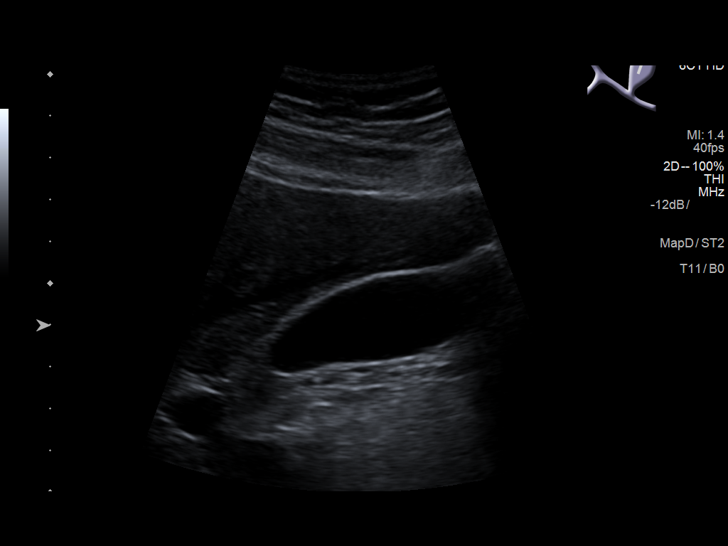
[im 14/37]
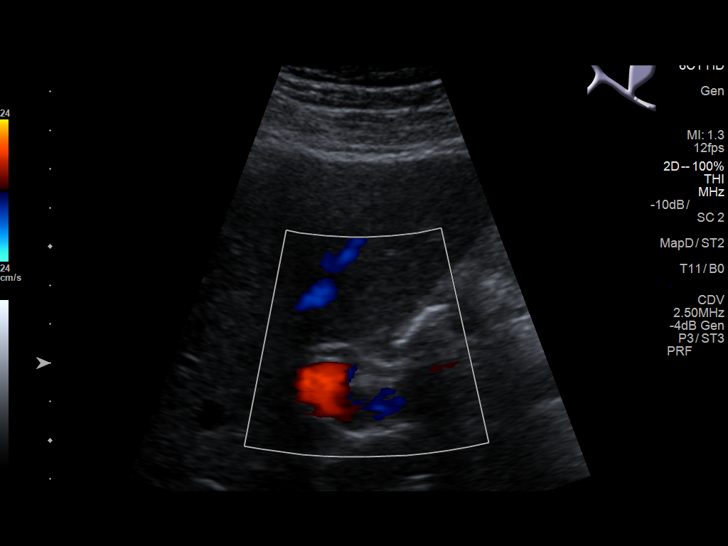
[im 17/37]
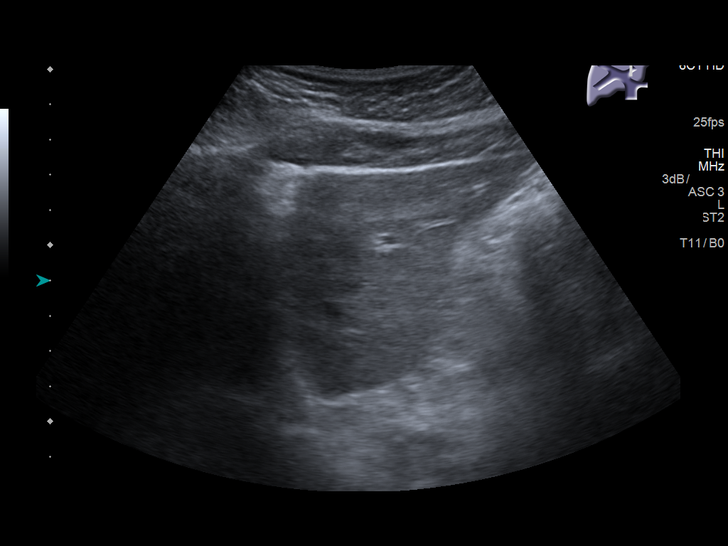
[im 20/37]
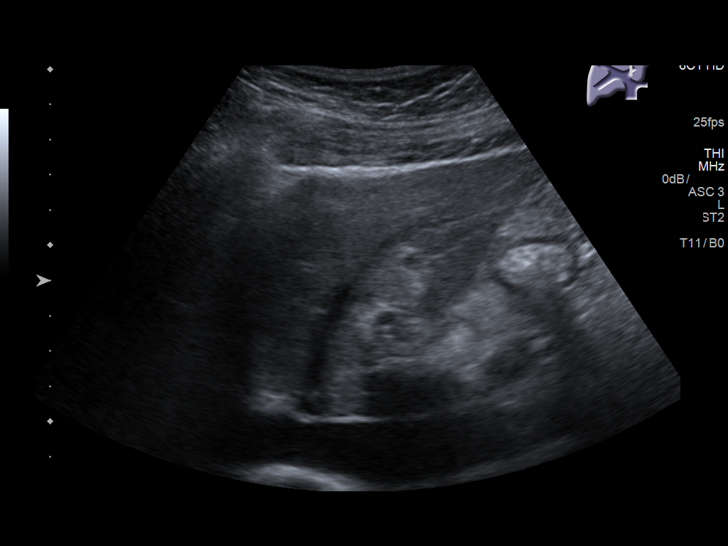
[im 23/37]
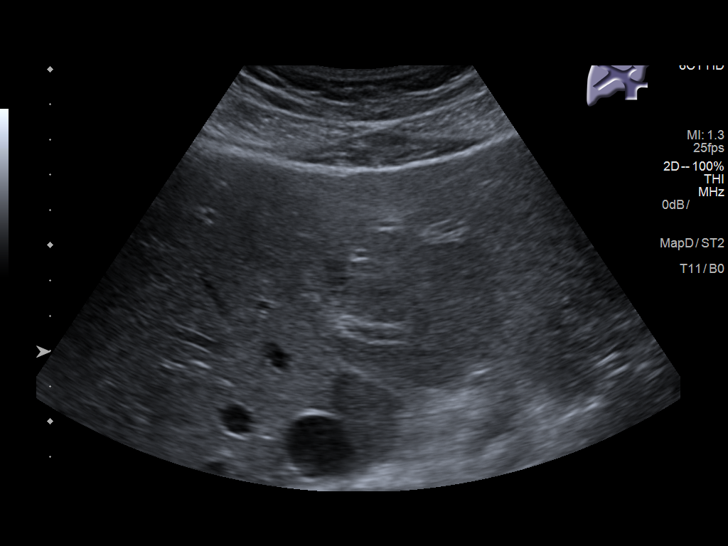
[im 25/37]
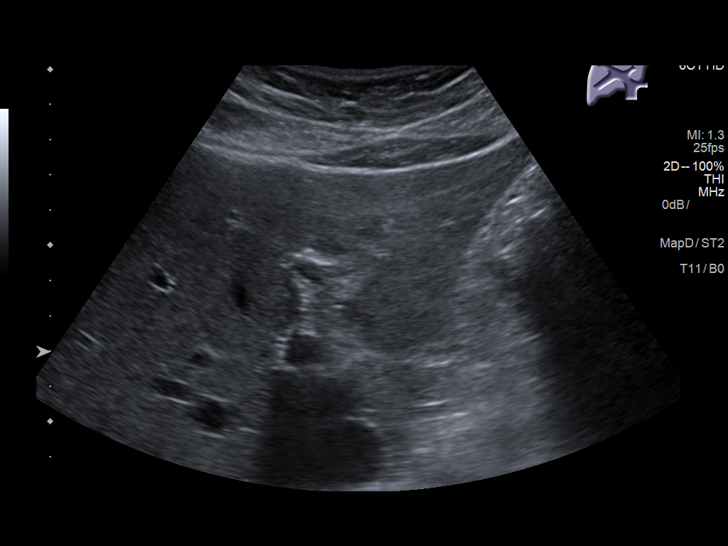
[im 28/37]
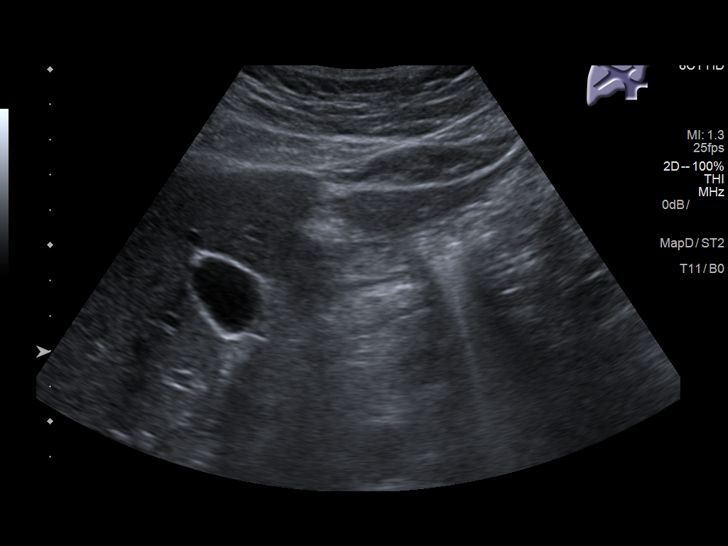
[im 31/37]
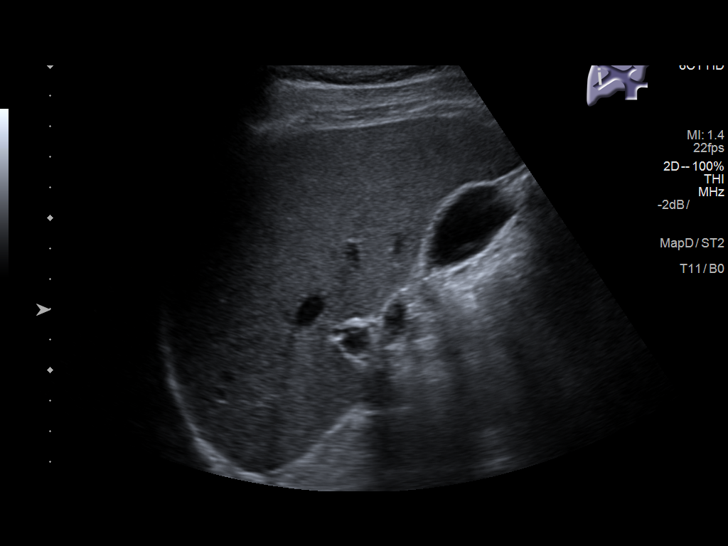
[im 34/37]
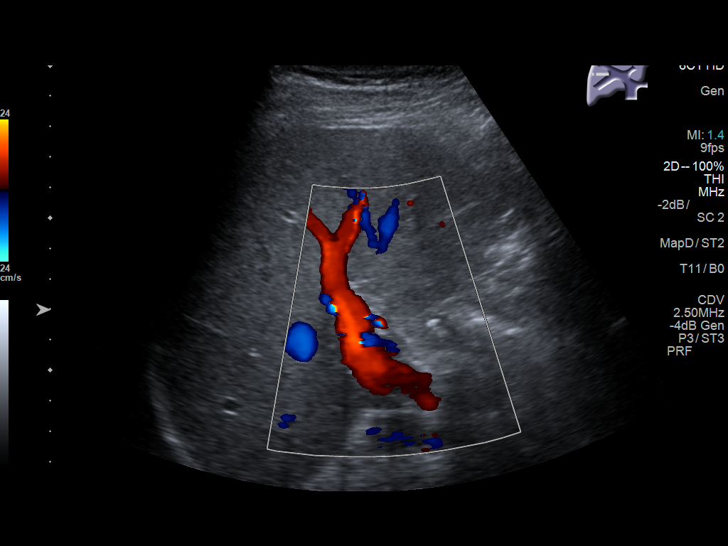
[im 37/37]
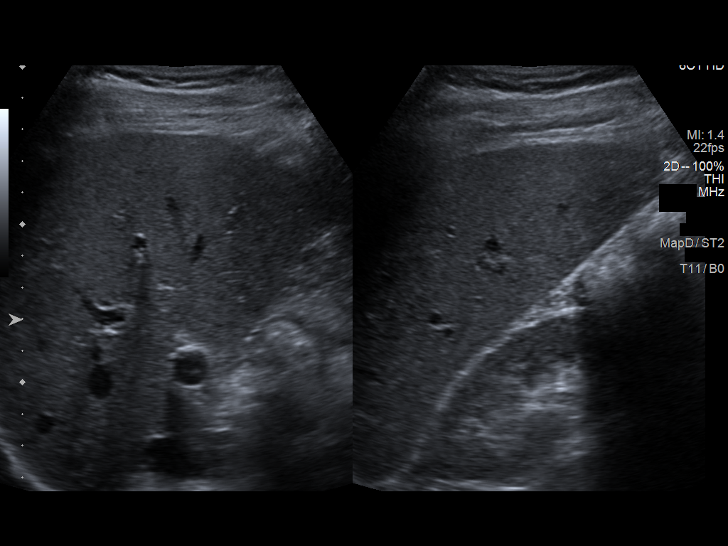

[14 of 25 positions shown; findings below may reference images not displayed]

FINDINGS: Gallbladder:

No gallstones or wall thickening visualized. No sonographic Murphy
sign noted by sonographer.

Common bile duct:

Diameter: 2.4 mm

Liver:

No focal lesion identified. Within normal limits in parenchymal
echogenicity. Portal vein is patent on color Doppler imaging with
normal direction of blood flow towards the liver.
IMPRESSION: Normal examination.

## 2018-05-23 ENCOUNTER — Encounter: Payer: Self-pay | Admitting: Internal Medicine

## 2018-09-24 ENCOUNTER — Ambulatory Visit (INDEPENDENT_AMBULATORY_CARE_PROVIDER_SITE_OTHER): Payer: PRIVATE HEALTH INSURANCE | Admitting: Internal Medicine

## 2018-09-24 ENCOUNTER — Encounter: Payer: Self-pay | Admitting: Internal Medicine

## 2018-09-24 DIAGNOSIS — F411 Generalized anxiety disorder: Secondary | ICD-10-CM

## 2018-09-24 DIAGNOSIS — Z0000 Encounter for general adult medical examination without abnormal findings: Secondary | ICD-10-CM

## 2018-09-24 NOTE — Assessment & Plan Note (Signed)
Healthy Advised regular exercise--but stays fit with his work He generally doesn't like the flu vaccine--but I did recommend it Can plan to repeat PE in 2-3 years (unless he prefers yearly)

## 2018-09-24 NOTE — Assessment & Plan Note (Signed)
Seems to be better in general--despite all that is going on Has lorazepam for prn use and doing well with that

## 2018-09-24 NOTE — Progress Notes (Signed)
Subjective:    Patient ID: Kent Thompson, male    DOB: 10/02/1990, 28 y.o.   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 LORazepam (ATIVAN) 1 MG tablet Take 1 tablet by mouth 3 (three) times daily as needed.     No current facility-administered medications on file prior to visit.     No Known Allergies  History reviewed. No pertinent past medical history.  History reviewed. No pertinent surgical history.  Family History  Problem Relation Age of Onset  . Depression Mother   . Kidney Stones Mother   . Esophageal cancer Maternal Grandfather     Social History   Socioeconomic History  . Marital status: Single    Spouse name: Not on file  . Number of children: Not on file  . Years of education: Not on file  . Highest education level: Not on file  Occupational History  . Occupation: Farms hay    Comment: self employed  Social Needs  . Financial resource strain: Not on file  . Food insecurity    Worry: Not on file    Inability: Not on file  . Transportation needs    Medical: Not on file    Non-medical: Not on file  Tobacco Use  . Smoking status: Former Games developermoker  . Smokeless tobacco: Former Engineer, waterUser  Substance and Sexual Activity  . Alcohol use: Yes    Comment: none since April  . Drug use: Never  . Sexual activity: Not on file  Lifestyle  . Physical activity    Days per week: Not on file    Minutes per session: Not on file  . Stress: Not on file  Relationships  . Social Musicianconnections    Talks on phone: Not on file    Gets  together: Not on file    Attends religious service: Not on file    Active member of club or organization: Not on file    Attends meetings of clubs or organizations: Not on file    Relationship status: Not on file  . Intimate partner violence    Fear of current or ex partner: Not on file    Emotionally abused: Not on file    Physically abused: Not on file    Forced sexual activity: Not on file  Other Topics Concern  . Not on file  Social History Narrative  . Not on file   Review of Systems  Constitutional: Negative for fatigue and unexpected weight change.       Wears seat belt most of the time  HENT: Negative for dental problem, hearing loss and tinnitus.        Overdue for dentist  Eyes: Negative for visual disturbance.       No diplopia or unilateral vision loss   Respiratory: Negative for cough, chest tightness and shortness of breath.   Cardiovascular: Negative for chest pain, palpitations and leg swelling.  Gastrointestinal: Negative for abdominal pain, blood in stool and constipation.       Occasional heartburn---tums helps. Rare prevacid  Endocrine: Negative for polydipsia and polyuria.  Genitourinary: Negative for difficulty urinating and urgency.       No sexual problems  Musculoskeletal: Negative for arthralgias, back pain and joint swelling.  Skin: Negative for rash.  Allergic/Immunologic: Positive for environmental allergies. Negative for immunocompromised state.       Tried something oral (immunotherapy?)-- hasn't needed the claritin much since  Neurological: Negative for dizziness, syncope and light-headedness.       Occ stress headache  Psychiatric/Behavioral: Negative for dysphoric mood and sleep disturbance. The patient is nervous/anxious.        Objective:   Physical Exam  Constitutional: He appears well-developed. No distress.  Respiratory: Effort normal. No respiratory distress.  Psychiatric: He has a normal mood and affect. His behavior is normal.            Assessment & Plan:

## 2018-12-12 IMAGING — CT CT RENAL STONE PROTOCOL
3 of 4 series · 9 of 46 positions shown, 16 images · non-contrast
Comparison: None.

CLINICAL DATA: Right-sided back and flank pain.  Urinary frequency.

EXAM:
CT ABDOMEN AND PELVIS WITHOUT CONTRAST
TECHNIQUE: Multidetector CT imaging of the abdomen and pelvis was performed
following the standard protocol without IV contrast.

[Series 4: lung bases · axial · 0.70mm/px · z∈[-160,-80]mm · 5 of 26 slices shown, 10 images]
[im 5/26  soft-tissue]
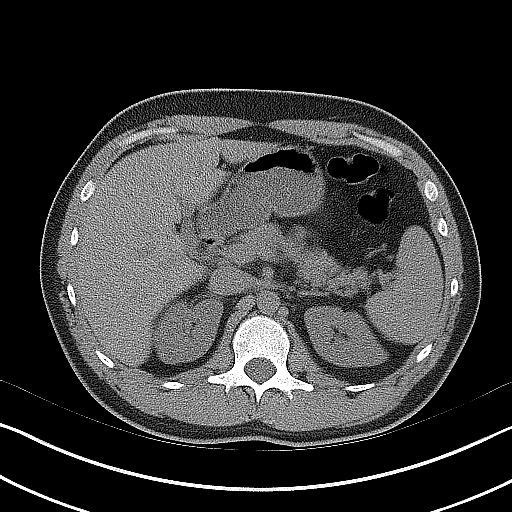
[im 5/26  bone]
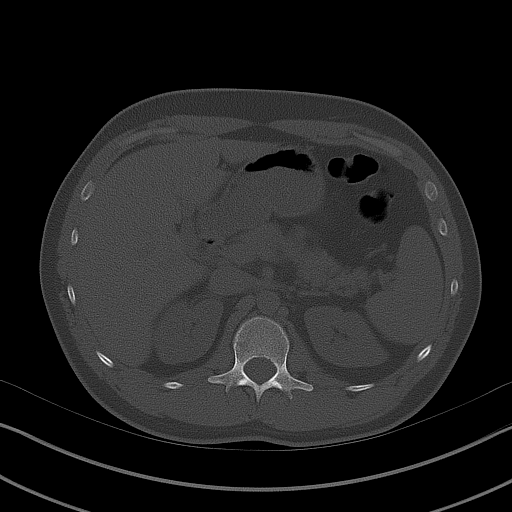
[im 9/26  soft-tissue]
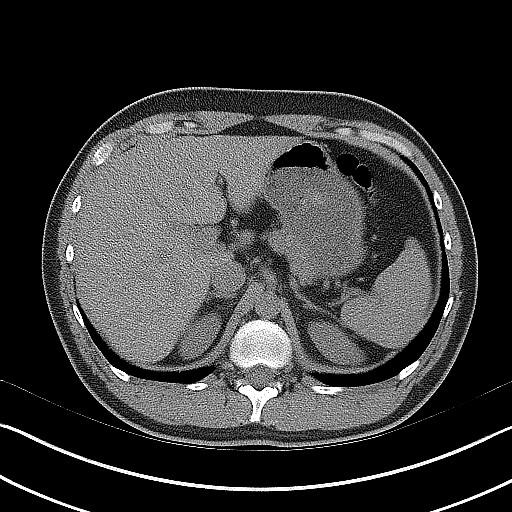
[im 9/26  lung]
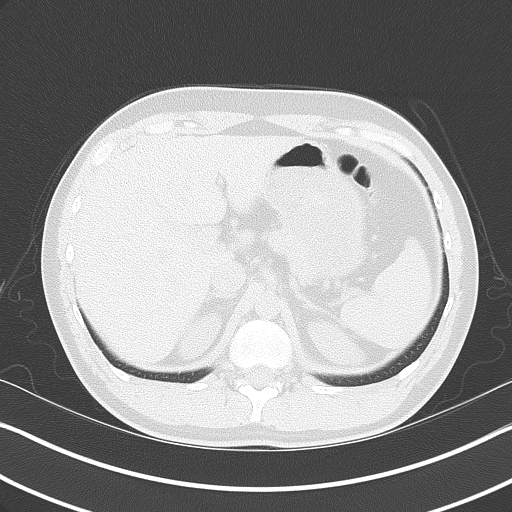
[im 13/26  soft-tissue]
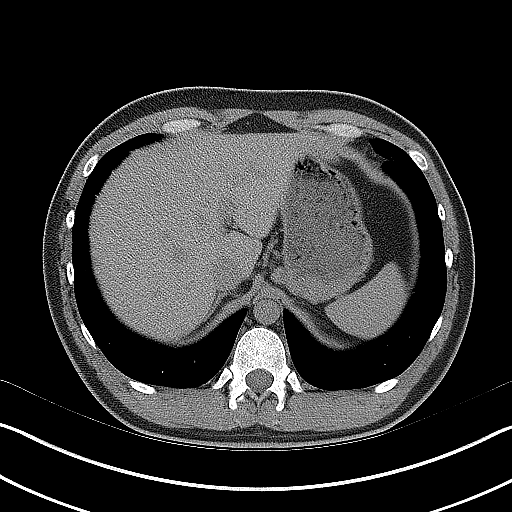
[im 13/26  lung]
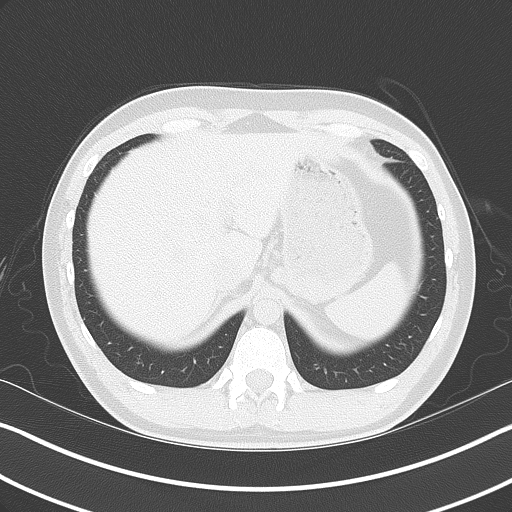
[im 17/26  soft-tissue]
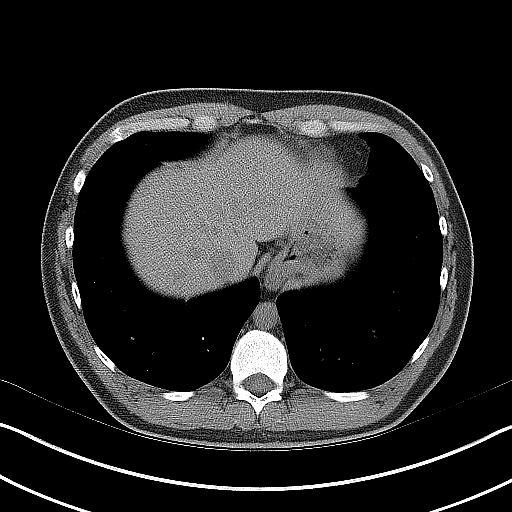
[im 17/26  lung]
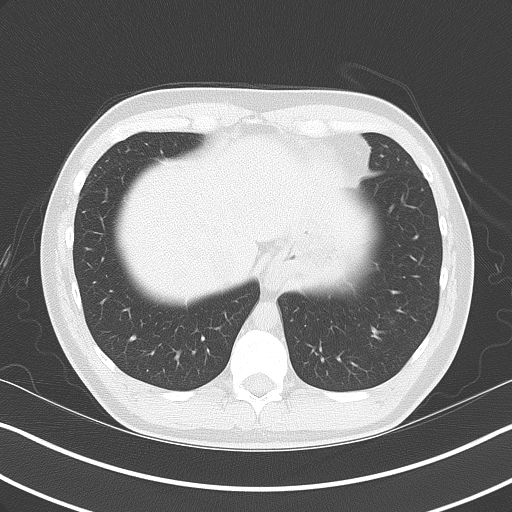
[im 21/26  soft-tissue]
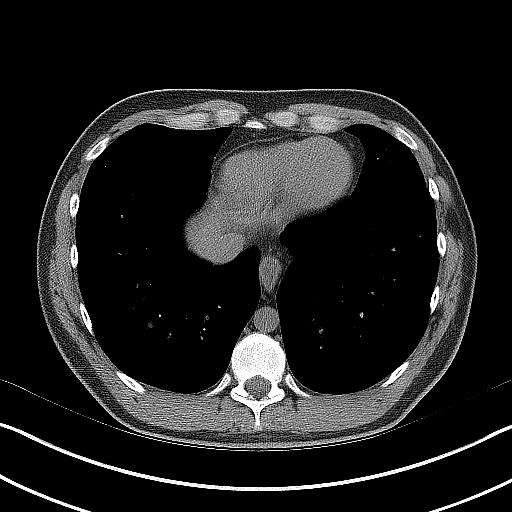
[im 21/26  lung]
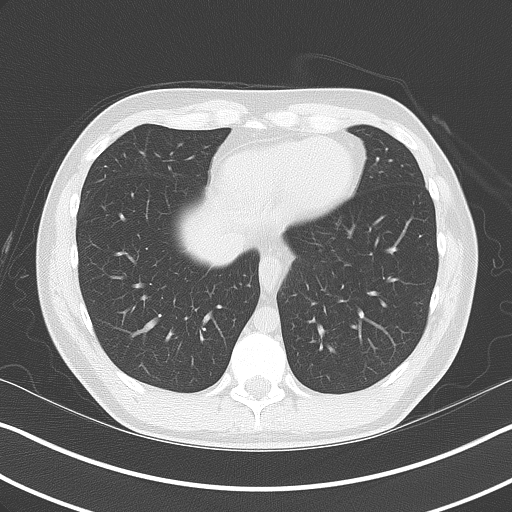

[Series 5: coronal · coronal · 0.65mm/px · 3 of 125 slices shown, 4 images]
[im 42/125  soft-tissue]
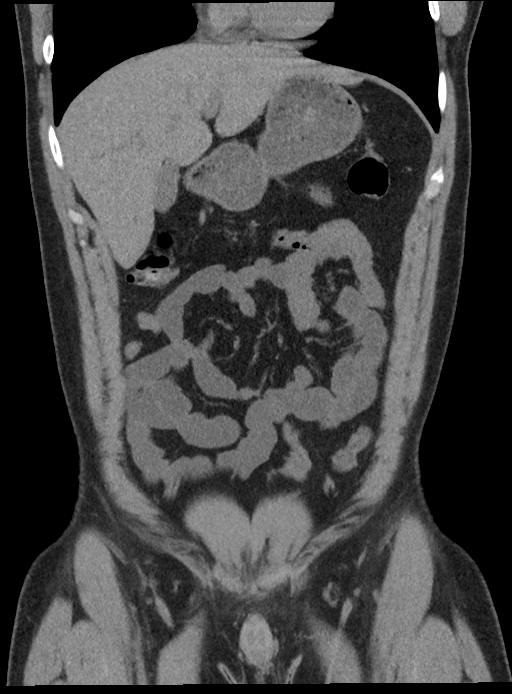
[im 56/125  soft-tissue]
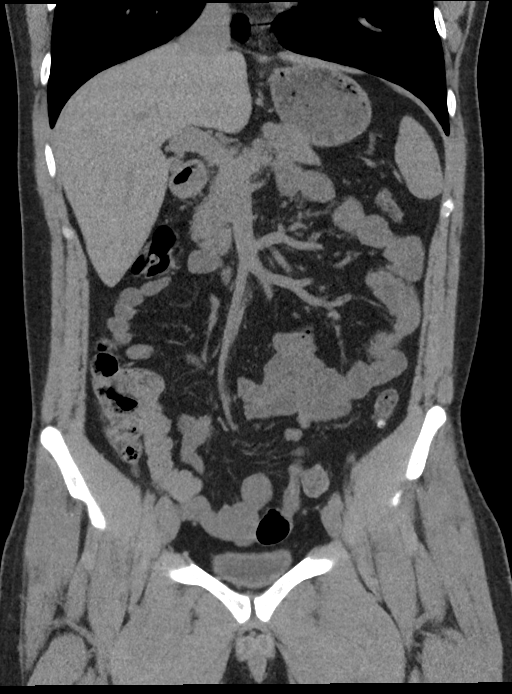
[im 56/125  bone]
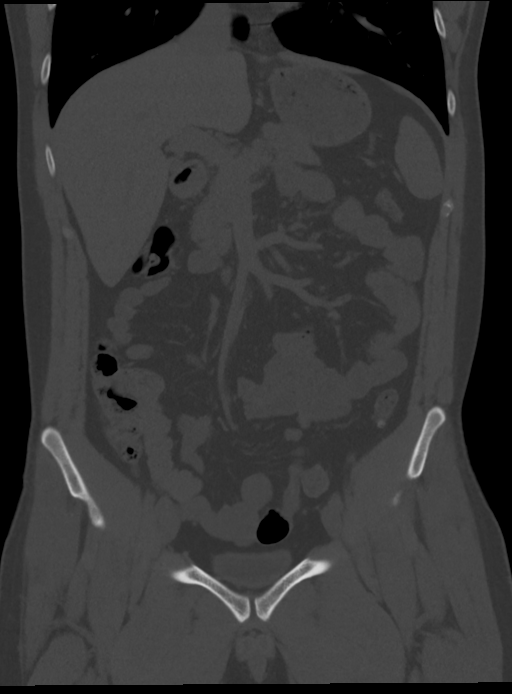
[im 69/125  soft-tissue]
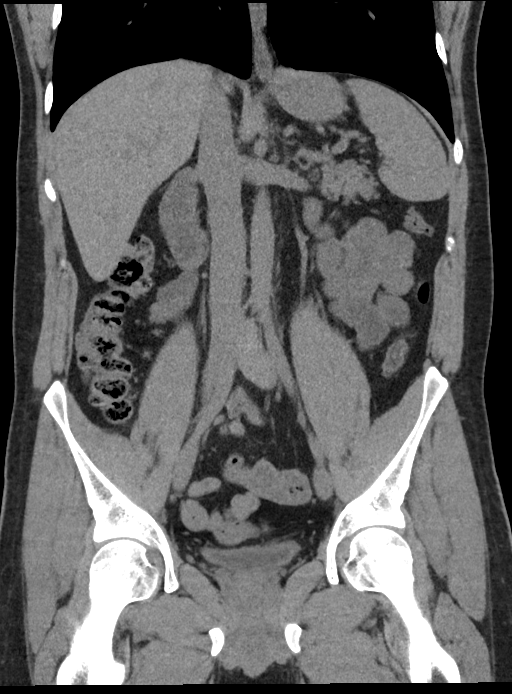

[Series 6: sagittal · sagittal · 0.49mm/px · 1 of 168 slices shown, 2 images]
[im 56/168  soft-tissue]
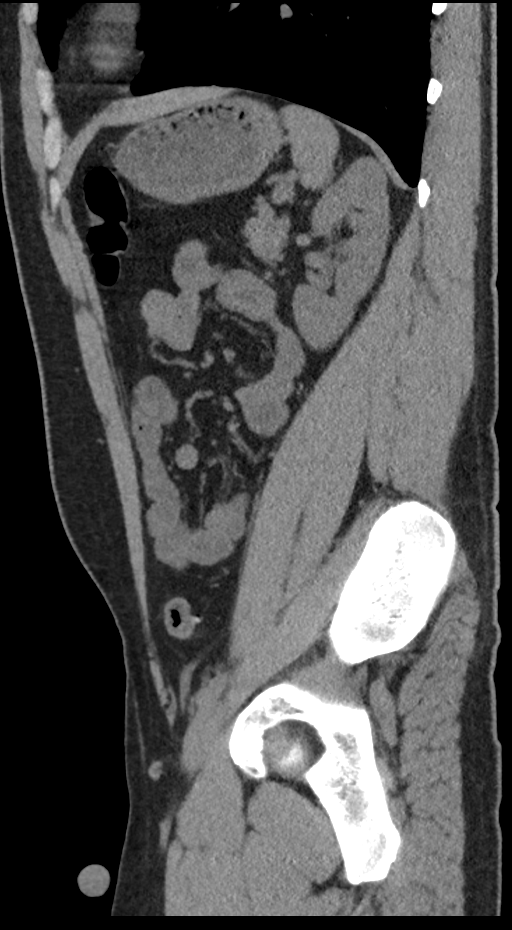
[im 56/168  bone]
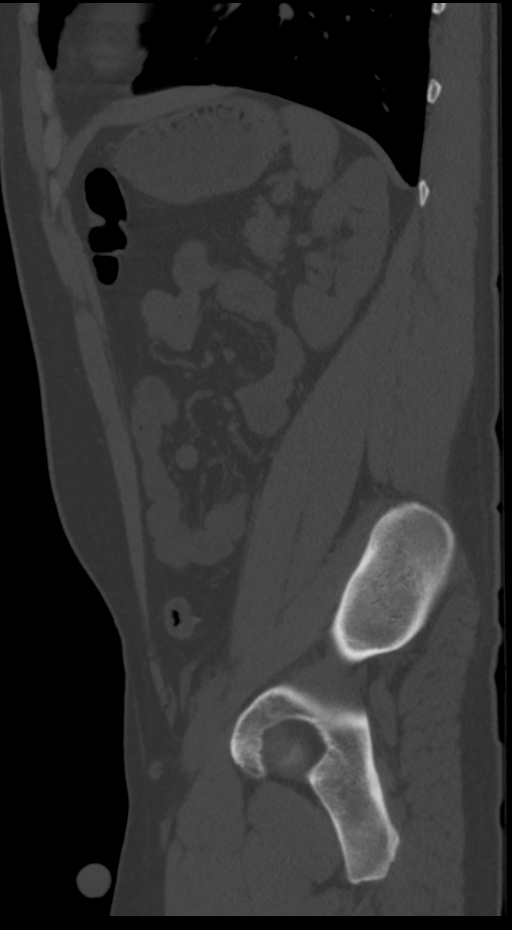

[9 of 46 positions shown; findings below may reference images not displayed]

FINDINGS: Lower chest: Lung bases are clear.  Small esophageal hiatal hernia.

Hepatobiliary: No focal liver abnormality is seen. No gallstones,
gallbladder wall thickening, or biliary dilatation.

Pancreas: Unremarkable. No pancreatic ductal dilatation or
surrounding inflammatory changes.

Spleen: Normal in size without focal abnormality.

Adrenals/Urinary Tract: Adrenal glands are unremarkable. Kidneys are
normal, without renal calculi, focal lesion, or hydronephrosis.
Bladder is unremarkable.

Stomach/Bowel: Stomach is within normal limits. Appendix appears
normal. No evidence of bowel wall thickening, distention, or
inflammatory changes.

Vascular/Lymphatic: No significant vascular findings are present. No
enlarged abdominal or pelvic lymph nodes.

Reproductive: Prostate is unremarkable.

Other: No abdominal wall hernia or abnormality. No abdominopelvic
ascites.

Musculoskeletal: No acute or significant osseous findings.
IMPRESSION: 1. No acute process demonstrated in the abdomen or pelvis.
2. No renal or ureteral stone or obstruction.
3. No bowel obstruction or inflammation.
4. Small esophageal hiatal hernia.
